# Patient Record
Sex: Female | Born: 2000 | Race: Black or African American | Hispanic: No | Marital: Single | Smoking: Never smoker
Health system: Southern US, Community
[De-identification: ages and names within clinical notes are randomized; demographics above are authoritative.]

## PROBLEM LIST (undated history)

## (undated) DIAGNOSIS — J45909 Unspecified asthma, uncomplicated: Secondary | ICD-10-CM

## (undated) HISTORY — PX: DILATION AND CURETTAGE OF UTERUS: SHX78

---
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
## 2020-10-13 ENCOUNTER — Ambulatory Visit (HOSPITAL_COMMUNITY)
Admission: EM | Admit: 2020-10-13 | Discharge: 2020-10-13 | Disposition: A | Discharge status: Home / Self Care | Payer: Medicaid Other | Attending: Urgent Care | Admitting: Urgent Care

## 2020-10-13 ENCOUNTER — Other Ambulatory Visit: Payer: Self-pay

## 2020-10-13 ENCOUNTER — Encounter (HOSPITAL_COMMUNITY): Payer: Self-pay

## 2020-10-13 ENCOUNTER — Inpatient Hospital Stay (HOSPITAL_BASED_OUTPATIENT_CLINIC_OR_DEPARTMENT_OTHER): Payer: Medicaid Other

## 2020-10-13 ENCOUNTER — Encounter (HOSPITAL_COMMUNITY): Payer: Self-pay | Admitting: Obstetrics & Gynecology

## 2020-10-13 ENCOUNTER — Inpatient Hospital Stay (HOSPITAL_COMMUNITY)
Admission: EM | Admit: 2020-10-13 | Discharge: 2020-10-13 | Disposition: A | Discharge status: Home / Self Care | Payer: Medicaid Other | Attending: Family Medicine | Admitting: Family Medicine

## 2020-10-13 DIAGNOSIS — O208 Other hemorrhage in early pregnancy: Secondary | ICD-10-CM | POA: Insufficient documentation

## 2020-10-13 DIAGNOSIS — R879 Unspecified abnormal finding in specimens from female genital organs: Secondary | ICD-10-CM

## 2020-10-13 DIAGNOSIS — N898 Other specified noninflammatory disorders of vagina: Secondary | ICD-10-CM | POA: Diagnosis not present

## 2020-10-13 DIAGNOSIS — O4692 Antepartum hemorrhage, unspecified, second trimester: Secondary | ICD-10-CM | POA: Diagnosis not present

## 2020-10-13 DIAGNOSIS — O468X2 Other antepartum hemorrhage, second trimester: Secondary | ICD-10-CM

## 2020-10-13 DIAGNOSIS — O418X20 Other specified disorders of amniotic fluid and membranes, second trimester, not applicable or unspecified: Secondary | ICD-10-CM

## 2020-10-13 DIAGNOSIS — Z3A14 14 weeks gestation of pregnancy: Secondary | ICD-10-CM | POA: Diagnosis not present

## 2020-10-13 DIAGNOSIS — O209 Hemorrhage in early pregnancy, unspecified: Secondary | ICD-10-CM | POA: Diagnosis present

## 2020-10-13 HISTORY — DX: Unspecified asthma, uncomplicated: J45.909

## 2020-10-13 LAB — URINALYSIS, ROUTINE W REFLEX MICROSCOPIC
Bilirubin Urine: NEGATIVE
Glucose, UA: NEGATIVE mg/dL
Hgb urine dipstick: NEGATIVE
Ketones, ur: NEGATIVE mg/dL
Leukocytes,Ua: NEGATIVE
Nitrite: NEGATIVE
Protein, ur: NEGATIVE mg/dL
Specific Gravity, Urine: 1.019 (ref 1.005–1.030)
pH: 6 (ref 5.0–8.0)

## 2020-10-13 LAB — WET PREP, GENITAL
Clue Cells Wet Prep HPF POC: NONE SEEN
Sperm: NONE SEEN
Trich, Wet Prep: NONE SEEN
Yeast Wet Prep HPF POC: NONE SEEN

## 2020-10-13 LAB — HIV ANTIBODY (ROUTINE TESTING W REFLEX): HIV Screen 4th Generation wRfx: NONREACTIVE

## 2020-10-13 LAB — POC URINE PREG, ED: Preg Test, Ur: POSITIVE — AB

## 2020-10-13 LAB — POCT FERN TEST: POCT Fern Test: NEGATIVE

## 2020-10-13 NOTE — Discharge Instructions (Signed)
Dr. Noralee Space (Maternal Fetal Medicine MD) recommends repeat MFM U/S in 1 week.  Seek care if pass blood clot and/or tissue Tylenol 1000 mg every 6 hours as needed for pain -- see below    Safe Medications in Pregnancy   Acne: Benzoyl Peroxide Salicylic Acid  Backache/Headache: Tylenol: 2 regular strength every 4 hours OR              2 Extra strength every 6 hours  Colds/Coughs/Allergies: Benadryl (alcohol free) 25 mg every 6 hours as needed Breath right strips Claritin Cepacol throat lozenges Chloraseptic throat spray Cold-Eeze- up to three times per day Cough drops, alcohol free Flonase (by prescription only) Guaifenesin Mucinex Robitussin DM (plain only, alcohol free) Saline nasal spray/drops Sudafed (pseudoephedrine) & Actifed ** use only after [redacted] weeks gestation and if you do not have high blood pressure Tylenol Vicks Vaporub Zinc lozenges Zyrtec   Constipation: Colace Ducolax suppositories Fleet enema Glycerin suppositories Metamucil Milk of magnesia Miralax Senokot Smooth move tea  Diarrhea: Kaopectate Imodium A-D  *NO pepto Bismol  Hemorrhoids: Anusol Anusol HC Preparation H Tucks  Indigestion: Tums Maalox Mylanta Zantac  Pepcid  Insomnia: Benadryl (alcohol free) 25mg  every 6 hours as needed Tylenol PM Unisom, no Gelcaps  Leg Cramps: Tums MagGel  Nausea/Vomiting:  Bonine Dramamine Emetrol Ginger extract Sea bands Meclizine  Nausea medication to take during pregnancy:  Unisom (doxylamine succinate 25 mg tablets) Take one tablet daily at bedtime. If symptoms are not adequately controlled, the dose can be increased to a maximum recommended dose of two tablets daily (1/2 tablet in the morning, 1/2 tablet mid-afternoon and one at bedtime). Vitamin B6 100mg  tablets. Take one tablet twice a day (up to 200 mg per day).  Skin Rashes: Aveeno products Benadryl cream or 25mg  every 6 hours as needed Calamine Lotion 1% cortisone  cream  Yeast infection: Gyne-lotrimin 7 Monistat 7   **If taking multiple medications, please check labels to avoid duplicating the same active ingredients **take medication as directed on the label ** Do not exceed 4000 mg of tylenol in 24 hours **Do not take medications that contain aspirin or ibuprofen

## 2020-10-13 NOTE — MAU Provider Note (Signed)
History     CSN: [id number]  Arrival date and time: 10/13/20 1132   Event Date/Time   First Provider Initiated Contact with Patient 10/13/20 1741      Chief Complaint  Patient presents with   Spotting   Ms. Anne Cervantes is a 20 y.o. year old G33P0010 female at [redacted]w[redacted]d weeks gestation who was sent to MAU from [facility] reporting vaginal spotting x 2 days and some bilateral lower pelvis cramping that she "thought was RLP."  She states the spotting started out as "brown in the the discharge" with wiping after using the BR. She receives [facility] with [facility] in [place], [place]. She reports having a "spot on her cervix that went away and unsure of the name, but something with her placenta that is 1.4 away from cervix." She is in [place] today visiting family.  Patient was able to pull up U/S results from MyChart on her phone (as shown below):     OB History     Gravida  2   Para      Term      Preterm      AB  1   Living         SAB  1   IAB      Ectopic      Multiple      Live Births              Past Medical History:  Diagnosis Date   Asthma     Past Surgical History:  Procedure Laterality Date   DILATION AND CURETTAGE OF UTERUS      Family History  Problem Relation Age of Onset   Healthy Mother    Healthy Father     Social History   Tobacco Use   Smoking status: Never   Smokeless tobacco: Never  Vaping Use   Vaping Use: Never used  Substance Use Topics   Alcohol use: Never   Drug use: Never    Allergies: Not on File  No medications prior to admission.    Review of Systems  Constitutional: Negative.   HENT: Negative.    Eyes: Negative.   Respiratory: Negative.    Cardiovascular: Negative.   Gastrointestinal: Negative.   Endocrine: Negative.   Genitourinary:  Positive for vaginal bleeding (brown spotting).  Musculoskeletal: Negative.   Skin: Negative.   Allergic/Immunologic: Negative.   Neurological: Negative.    Hematological: Negative.   Psychiatric/Behavioral: Negative.    Physical Exam   Blood pressure 115/74, pulse 96, temperature 98.7 F (37.1 C), resp. rate 18, height 5\' 3"  (1.6 m), weight 58.4 kg, SpO2 100 %.  Physical Exam Vitals and nursing note reviewed. Exam conducted with a chaperone present.  Constitutional:      Appearance: Normal appearance. She is normal weight.  Pulmonary:     Effort: Pulmonary effort is normal.  Abdominal:     Palpations: Abdomen is soft.  Genitourinary:    General: Normal vulva.     Comments: Pelvic exam: External genitalia normal, SE: vaginal walls pink and well rugated, cervix is smooth, pink, no lesions, moderate amt of light brown watery vaginal d/c -- WP, GC/CT done, cervix visually closed, Uterus is non-tender, no CMT or friability, no adnexal tenderness.  Musculoskeletal:        General: Normal range of motion.  Skin:    General: Skin is warm and dry.  Neurological:     Mental Status: She is alert and oriented to person, place, and  time.  Psychiatric:        Mood and Affect: Mood normal.        Behavior: Behavior normal.        Thought Content: Thought content normal.        Judgment: Judgment normal.    MAU Course  Procedures  MDM Wet Prep GC/CT -- Results pending Crist Fat Slide  Results for orders placed or performed during the hospital encounter of 10/13/20 (from the past 24 hour(s))  Urinalysis, Routine w reflex microscopic Urine, Clean Catch     Status: Abnormal   Collection Time: 10/13/20  3:08 PM  Result Value Ref Range   Color, Urine YELLOW YELLOW   APPearance HAZY (A) CLEAR   Specific Gravity, Urine 1.019 1.005 - 1.030   pH 6.0 5.0 - 8.0   Glucose, UA NEGATIVE NEGATIVE mg/dL   Hgb urine dipstick NEGATIVE NEGATIVE   Bilirubin Urine NEGATIVE NEGATIVE   Ketones, ur NEGATIVE NEGATIVE mg/dL   Protein, ur NEGATIVE NEGATIVE mg/dL   Nitrite NEGATIVE NEGATIVE   Leukocytes,Ua NEGATIVE NEGATIVE  HIV Antibody (routine testing w rflx)      Status: None   Collection Time: 10/13/20  4:53 PM  Result Value Ref Range   HIV Screen 4th Generation wRfx Non Reactive Non Reactive  Wet prep, genital     Status: Abnormal   Collection Time: 10/13/20  5:36 PM   Specimen: Vaginal  Result Value Ref Range   Yeast Wet Prep HPF POC NONE SEEN NONE SEEN   Trich, Wet Prep NONE SEEN NONE SEEN   Clue Cells Wet Prep HPF POC NONE SEEN NONE SEEN   WBC, Wet Prep HPF POC MANY (A) NONE SEEN   Sperm NONE SEEN   POCT fern test     Status: None   Collection Time: 10/13/20  6:40 PM  Result Value Ref Range   POCT Fern Test Negative = intact amniotic membranes    *Consult with Dr. Judeth Cornfield @ 2026 - discussed U/S results, recommended tx plan advise patient of possible blood clot in LUS, seek care if she passes anything that looks like tissue and take it with her or seek care if it is a blood clot, but no need to save the blood clot; needs to have F/U MFM U/S in 1 week.  Discussed results and Dr. Zannie Kehr recommendation with Dr. Macon Large @ 2030- ok to d/c home, agrees with Dr. Zannie Kehr plan   [place] MFM OB Transvaginal  Result Date: 10/13/2020 ----------------------------------------------------------------------  OBSTETRICS REPORT                         (Signed Final 10/13/2020 08:50 pm) ---------------------------------------------------------------------- Patient Info  ID #:        [id number]                          D.O.B.:  [birth year] (20 yrs)  Name:        Anne Cervantes                 Visit Date: 10/13/2020 08:00 pm ---------------------------------------------------------------------- Performed By  Attending:         Noralee Space MD        Referred By:       [facility] MAU/Triage  Performed By:      Emeline Darling BS,      [facility]                                                               [facility] ---------------------------------------------------------------------- Orders  #   Description                          Code         Ordered By  1   US MFM OB LIMITED                    76815.01     Bleu Minerd  2   US MFM OB TRANSVAGINAL               [id number]      Zenab Gronewold ----------------------------------------------------------------------  #   Order #                    Accession #                 Episode #  1   [id number]                  [id number]                  [id number]  2   [id number]                  [id number]                  [id number] ---------------------------------------------------------------------- Indications  Vaginal bleeding in pregnancy, second           O46.92  trimester  [redacted] weeks gestation of pregnancy                 Z3A.14  Subchorionic hemorrhage, antepartum             O45.90 ---------------------------------------------------------------------- Fetal Evaluation  Num Of Fetuses:          1  Fetal Heart Rate(bpm):   166  Cardiac Activity:        Observed  Presentation:            Cephalic  Placenta:                Visualized Posterior  P. Cord Insertion:       Visualized  Amniotic Fluid  AFI FV:      Within normal limits                              Largest Pocket(cm)  3.8 ---------------------------------------------------------------------- OB History  Gravidity:     2         Term:  0          Prem:  0        SAB:   1  TOP:           0       Ectopic: 0         Living: 0 ---------------------------------------------------------------------- Gestational Age  Best:           14w 1d    Det. By:  Marcella Dubs           EDD:  04/12/21 ---------------------------------------------------------------------- Anatomy  Stomach:                Visualized             Abdominal Wall:         Visualized ---------------------------------------------------------------------- Cervix Uterus Adnexa  Cervix  Normal appearance by transabdominal scan.   Uterus  LUS complex area measures 5.7 x 4.3 x 4.9 cm ---------------------------------------------------------------------- Impression  Patient is being  evaluated for c/o vaginal bleeding (spotting).  She does not have severe abdominal pain.  Patient had ultrasound at an outpatient facility of [facility]  on 10/03/20 and the ultrasound showed an intrauterine pregnancy consistent with 12w 5d. A small  subchorionic hemorrhage was reported. No twin pregnancy  was detected  (MyChart Report on Patient's phone scanned).  A limited ultrasound study was performed. Good fetal heart  activity is seen. Placenta is posterior. An irregular mass in the  lower uterus at the inferior edge of the placenta, measuring 5.7  x 4.3 x 4.9 cm, is seen. This could represent a blood clot.  Discussed with MAU Raelyn Mora). ---------------------------------------------------------------------- Recommendations  -Discharge instruction should include the possibility of passing a  large blood clot.  -Follow-up scan in a week (MFM). ----------------------------------------------------------------------                  Noralee Space, MD Electronically Signed Final Report   10/13/2020 08:50 pm ----------------------------------------------------------------------  Assessment and Plan  Abnormal vaginal fluids  - Reassurance given that fern slide was negative for rupture of membranes  Subchorionic hematoma in second trimester  - Information provided on [facility] and vaginal bleeding in 2nd trimester pregnancy  - Advised ok to take Tylenol 1000 mg every 6 hrs prn cramping  - Discharge patient  - Advised to call her OB provider to get a sooner f/u appt scheduled - Patient verbalized an understanding of the plan of care and agrees.    Raelyn Mora, CNM 10/13/2020, 5:41 PM

## 2020-10-13 NOTE — MAU Note (Signed)
Presents with c/o spotting that began 2 days ago, reports started as light brown, turned light pink, now it's light brown.  Endorses abdominal mild pain/cramping.

## 2020-10-13 NOTE — ED Provider Notes (Signed)
Emergency Medicine Provider OB Triage Evaluation Note  Anne Cervantes is a 20 y.o. female, G2P0, at roughly 14w gestation who presents to the emergency department with complaints of vaginal bleeding x2 days. Describes bleeding as brown spotting. No clots. Not enough to fill a pad. Patient is from [place] and has already had an [place] to confirm IUP during this pregnancy. Vaginal bleeding associated with abdominal cramps.   Review of  Systems  Positive: vaginal bleeding Negative: fever  Physical Exam  BP 118/75   Pulse 93   Temp 98.5 F (36.9 C)   Resp 18   SpO2 100%  General: Awake, no distress  HEENT: Atraumatic  Resp: Normal effort  Cardiac: Normal rate Abd: Nondistended, nontender  MSK: Moves all extremities without difficulty Neuro: Speech clear  Medical Decision Making  Pt evaluated for pregnancy concern and is stable for transfer to MAU. Pt is in agreement with plan for transfer.  12:40 PM Discussed with MAU MD, Dr. Adrian Blackwater, who accepts patient in transfer.  Clinical Impression  No diagnosis found.     Jesusita Oka 10/13/20 1242    Arby Barrette, MD 10/23/20 [phone]

## 2020-10-13 NOTE — ED Triage Notes (Addendum)
pt states she is [redacted]weeks pregnant and has been having vaginal spotting for two days. Vaginal discharge started brown, pink for one day and today its brown discharge. pt states small amount. denies headaches/ dizziness. pt states some abd pain that comes and goes, denies flank pain.   Patient is being discharged from the Urgent Care and sent to the Emergency Department via personal vehicle.  Per [place] PA, patient is in need of higher level of care due to vaginal discharge fore two days. Patient is aware and verbalizes understanding of plan of care.  Vitals:   10/13/20 1115  BP: 112/68  Pulse: 95  Resp: 16  Temp: 98.7 F (37.1 C)  SpO2: 100%

## 2020-10-14 LAB — GC/CHLAMYDIA PROBE AMP (~~LOC~~) NOT AT ARMC
Chlamydia: NEGATIVE
Comment: NEGATIVE
Comment: NORMAL
Neisseria Gonorrhea: NEGATIVE

## 2020-10-27 ENCOUNTER — Other Ambulatory Visit: Payer: Self-pay

## 2020-10-27 ENCOUNTER — Inpatient Hospital Stay (HOSPITAL_COMMUNITY)
Admission: AD | Admit: 2020-10-27 | Discharge: 2020-10-28 | Disposition: A | Discharge status: Home / Self Care | Payer: Medicaid Other | Attending: Obstetrics & Gynecology | Admitting: Obstetrics & Gynecology

## 2020-10-27 DIAGNOSIS — O209 Hemorrhage in early pregnancy, unspecified: Secondary | ICD-10-CM | POA: Diagnosis present

## 2020-10-27 DIAGNOSIS — O418X20 Other specified disorders of amniotic fluid and membranes, second trimester, not applicable or unspecified: Secondary | ICD-10-CM

## 2020-10-27 DIAGNOSIS — R109 Unspecified abdominal pain: Secondary | ICD-10-CM | POA: Diagnosis present

## 2020-10-27 DIAGNOSIS — Z3A16 16 weeks gestation of pregnancy: Secondary | ICD-10-CM | POA: Insufficient documentation

## 2020-10-27 DIAGNOSIS — O99612 Diseases of the digestive system complicating pregnancy, second trimester: Secondary | ICD-10-CM | POA: Diagnosis not present

## 2020-10-27 DIAGNOSIS — O26892 Other specified pregnancy related conditions, second trimester: Secondary | ICD-10-CM | POA: Insufficient documentation

## 2020-10-27 DIAGNOSIS — O99512 Diseases of the respiratory system complicating pregnancy, second trimester: Secondary | ICD-10-CM | POA: Insufficient documentation

## 2020-10-27 DIAGNOSIS — O208 Other hemorrhage in early pregnancy: Secondary | ICD-10-CM | POA: Diagnosis not present

## 2020-10-27 DIAGNOSIS — O468X2 Other antepartum hemorrhage, second trimester: Secondary | ICD-10-CM | POA: Diagnosis not present

## 2020-10-27 DIAGNOSIS — K5909 Other constipation: Secondary | ICD-10-CM | POA: Diagnosis not present

## 2020-10-27 NOTE — MAU Note (Signed)
Pt states she was seen here 2 weeks ago for bleeding and has had bleeding off/on but tonight it got a lot heavier. Mild cramping x 2 days.

## 2020-10-28 ENCOUNTER — Inpatient Hospital Stay (HOSPITAL_BASED_OUTPATIENT_CLINIC_OR_DEPARTMENT_OTHER): Payer: Medicaid Other

## 2020-10-28 ENCOUNTER — Encounter (HOSPITAL_COMMUNITY): Payer: Self-pay | Admitting: Obstetrics & Gynecology

## 2020-10-28 DIAGNOSIS — O4592 Premature separation of placenta, unspecified, second trimester: Secondary | ICD-10-CM | POA: Diagnosis not present

## 2020-10-28 DIAGNOSIS — O321XX0 Maternal care for breech presentation, not applicable or unspecified: Secondary | ICD-10-CM

## 2020-10-28 DIAGNOSIS — O4692 Antepartum hemorrhage, unspecified, second trimester: Secondary | ICD-10-CM

## 2020-10-28 DIAGNOSIS — Z3A16 16 weeks gestation of pregnancy: Secondary | ICD-10-CM

## 2020-10-28 DIAGNOSIS — O99612 Diseases of the digestive system complicating pregnancy, second trimester: Secondary | ICD-10-CM | POA: Diagnosis not present

## 2020-10-28 DIAGNOSIS — K5909 Other constipation: Secondary | ICD-10-CM | POA: Diagnosis not present

## 2020-10-28 DIAGNOSIS — O468X2 Other antepartum hemorrhage, second trimester: Secondary | ICD-10-CM

## 2020-10-28 LAB — URINALYSIS, MICROSCOPIC (REFLEX)

## 2020-10-28 LAB — URINALYSIS, ROUTINE W REFLEX MICROSCOPIC
Bilirubin Urine: NEGATIVE
Glucose, UA: NEGATIVE mg/dL
Ketones, ur: NEGATIVE mg/dL
Nitrite: NEGATIVE
Protein, ur: NEGATIVE mg/dL
Specific Gravity, Urine: 1.025 (ref 1.005–1.030)
pH: 6 (ref 5.0–8.0)

## 2020-10-28 MED ORDER — POLYETHYLENE GLYCOL 3350 17 G PO PACK: 17.0000 g | PACK | Freq: Every day | ORAL | 0 refills | Status: AC

## 2020-10-28 NOTE — MAU Provider Note (Signed)
Chief Complaint:  Vaginal Bleeding and Abdominal Pain   Event Date/Time   First Provider Initiated Contact with Patient 10/28/20 0043     HPI: Anne Cervantes is a 20 y.o. G2P0010 at [redacted]w[redacted]d who presents to maternity admissions reporting increased dark brown vaginal bleeding. She has been seen for this issue before, both here in MAU and at her regular OB at [facility] in [place]. She has two areas of concern, a [facility] and then an area of blood collection just above the cervix (which has remained long and closed). Her follow up ultrasounds are captured in Media. She has had dark brown spotting to bleeding daily but today is heavier. No IC since the bleeding began. Notes bleeding is always heavier after ultrasounds, but last one was about a week ago. No other physical symptoms.  Pregnancy Course: See above.  Past Medical History:  Diagnosis Date   Asthma    OB History  Gravida Para Term Preterm AB Living  2       1    SAB IAB Ectopic Multiple Live Births  1            # Outcome Date GA Lbr Len/2nd Weight Sex Delivery Anes PTL Lv  2 Current           1 SAB 04/12/20 [redacted]w[redacted]d          Past Surgical History:  Procedure Laterality Date   DILATION AND CURETTAGE OF UTERUS     Family History  Problem Relation Age of Onset   Healthy Mother    Healthy Father    Social History   Tobacco Use   Smoking status: Never   Smokeless tobacco: Never  Vaping Use   Vaping Use: Never used  Substance Use Topics   Alcohol use: Never   Drug use: Never   Not on File No medications prior to admission.    I have reviewed patient's Past Medical Hx, Surgical Hx, Family Hx, Social Hx, medications and allergies.   ROS:  Pertinent items noted in HPI and remainder of comprehensive ROS otherwise negative.  Physical Exam  Patient Vitals for the past 24 hrs:  BP Temp Temp src Pulse Resp SpO2  10/28/20 0224 116/77 98.7 F (37.1 C) Oral 99 18 --  10/27/20 2358 111/75 98.7 F (37.1 C) Oral (!) 102 17 100 %    Constitutional: Well-developed, well-nourished female in no acute distress.  Cardiovascular: normal rate & rhythm, no murmur Respiratory: normal effort, lung sounds clear throughout GI: Abd soft, non-tender, gravid appropriate for gestational age. Pos BS x 4 MS: Extremities nontender, no edema, normal ROM Neurologic: Alert and oriented x 4.  GU: no CVA tenderness Pelvic deferred, sent to U/S   FHR: 157   Labs: Results for orders placed or performed during the hospital encounter of 10/27/20 (from the past 24 hour(s))  Urinalysis, Routine w reflex microscopic Urine, Clean Catch     Status: Abnormal   Collection Time: 10/27/20 11:36 PM  Result Value Ref Range   Color, Urine YELLOW YELLOW   APPearance CLOUDY (A) CLEAR   Specific Gravity, Urine 1.025 1.005 - 1.030   pH 6.0 5.0 - 8.0   Glucose, UA NEGATIVE NEGATIVE mg/dL   Hgb urine dipstick LARGE (A) NEGATIVE   Bilirubin Urine NEGATIVE NEGATIVE   Ketones, ur NEGATIVE NEGATIVE mg/dL   Protein, ur NEGATIVE NEGATIVE mg/dL   Nitrite NEGATIVE NEGATIVE   Leukocytes,Ua SMALL (A) NEGATIVE  Urinalysis, Microscopic (reflex)     Status: Abnormal  Collection Time: 10/27/20 11:36 PM  Result Value Ref Range   RBC / HPF 6-10 0 - 5 RBC/hpf   WBC, UA 6-10 0 - 5 WBC/hpf   Bacteria, UA RARE (A) NONE SEEN   Squamous Epithelial / LPF 6-10 0 - 5   Mucus PRESENT     Imaging:  See media report for preliminary (which makes no mention of areas of bleeding). Report directly from sonographer Olean Ree) was that areas of bleeding had decreased slightly from previous measurements. Blood noted on exam wand, but no free flowing blood.  MAU Course: Orders Placed This Encounter  Procedures   [place] MFM OB LIMITED   US MFM OB Transvaginal   Urinalysis, Routine w reflex microscopic Urine, Clean Catch   Urinalysis, Microscopic (reflex)   Discharge patient   Meds ordered this encounter  Medications   polyethylene glycol (MIRALAX) 17 g packet    Sig: Take  17 g by mouth daily.    Dispense:  14 each    Refill:  0    Order Specific Question:   Supervising Provider    Answer:   Samara Snide   MDM: Discussed findings with patient and reviewed signs that warrant immediate return to MAU. Advised continued pelvic rest and light duty only. Pt expressed understanding.  Assessment: 1. Subchorionic hematoma in second trimester, single or unspecified fetus   2. Vaginal bleeding affecting early pregnancy   3. Other constipation    Plan: Discharge home in stable condition with second trimester bleeding precautions.    Follow-up Information     [facility] - [place]. Go to.   Why: as scheduled for ongoing prenatal care                Allergies as of 10/28/2020   Not on File      Medication List     TAKE these medications    multivitamin-prenatal 27-0.8 MG Tabs tablet Take 1 tablet by mouth daily at 12 noon.   polyethylene glycol 17 g packet Commonly known as: MiraLax Take 17 g by mouth daily.       Edd Arbour, CNM, MSN, IBCLC Certified Nurse Midwife, [facility]

## 2020-10-28 NOTE — Discharge Instructions (Signed)

## 2023-01-07 IMAGING — US US MFM OB LIMITED
1 series · 14 of 28 positions shown · non-contrast
Comparison: none

[Series 1: us mfm ob limited · 63 acquisitions, 14 frames shown]
[im 3/63]
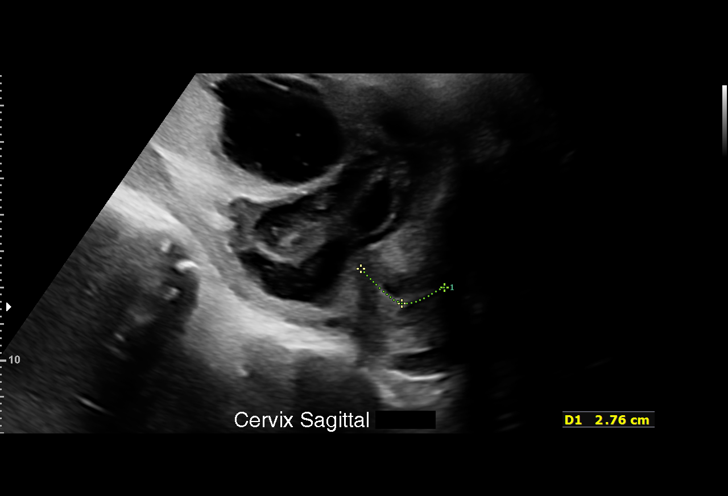
[im 7/63]
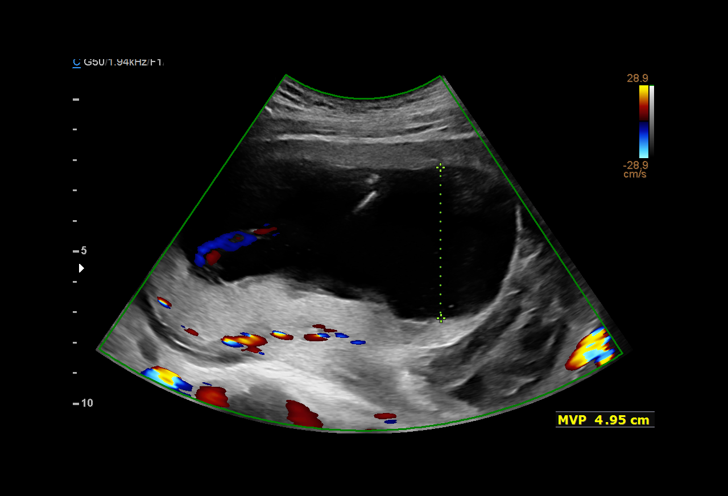
[im 12/63]
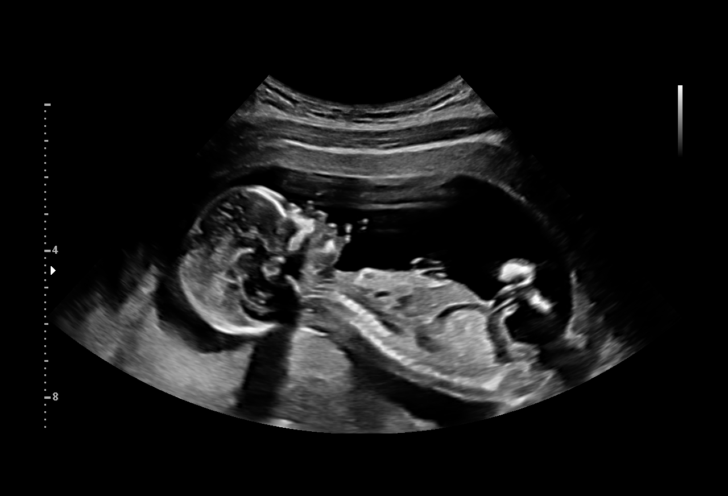
[im 17/63]
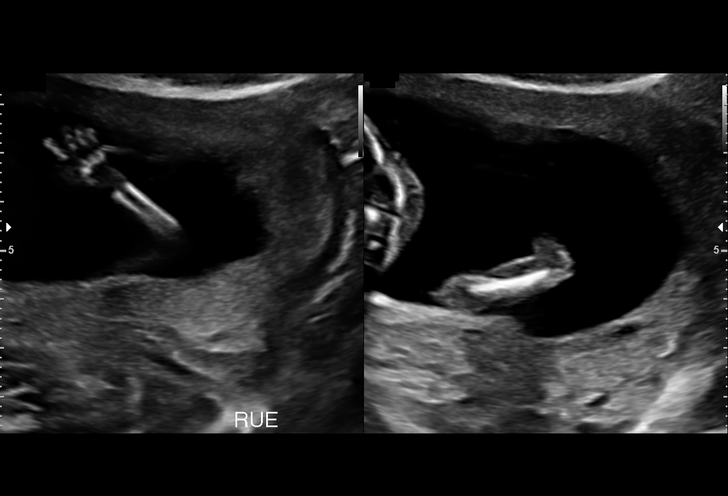
[im 21/63]
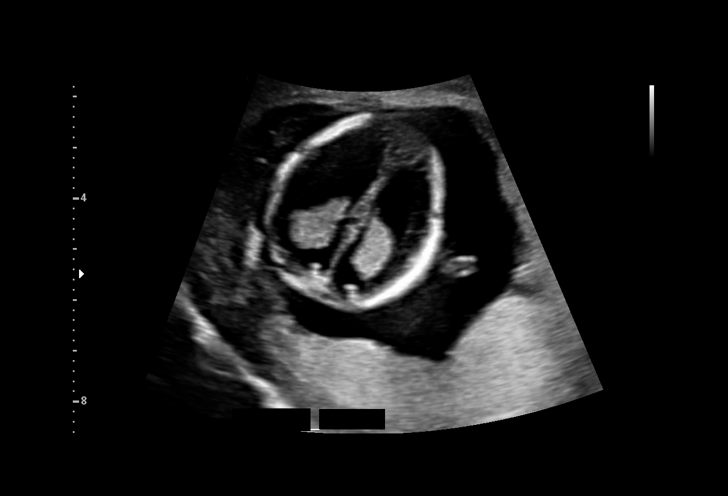
[im 26/63]
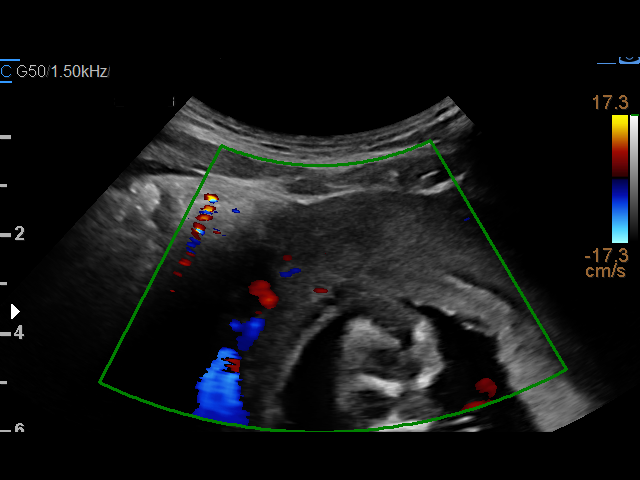
[im 30/63]
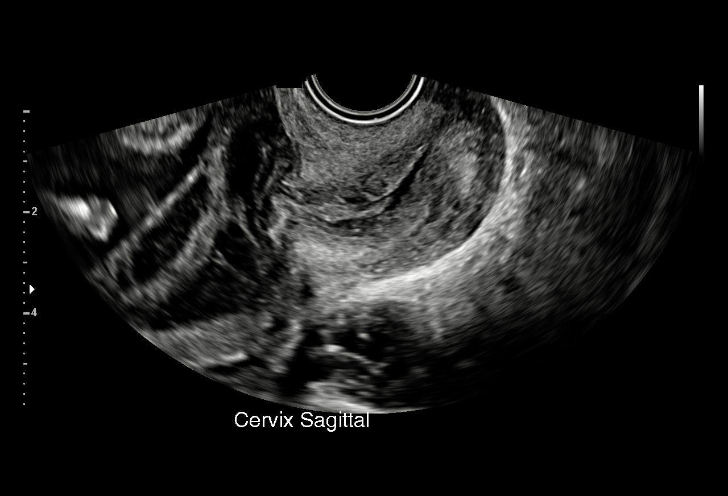
[im 35/63]
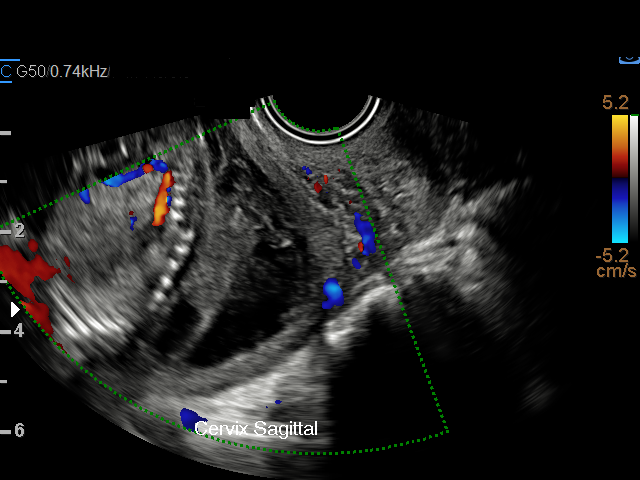
[im 40/63]
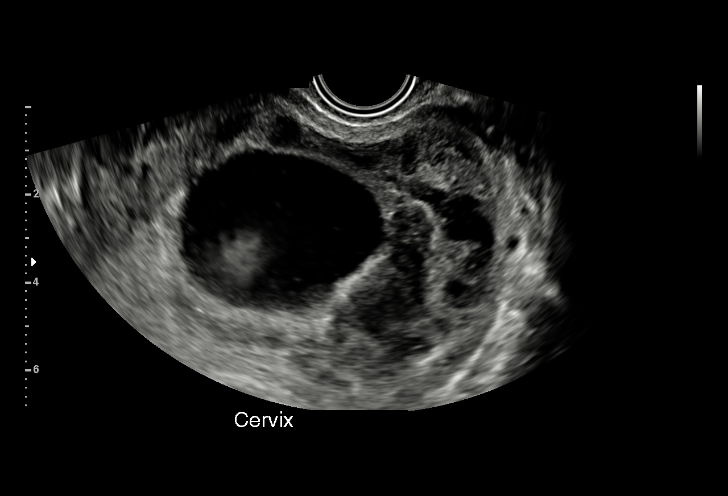
[im 44/63]
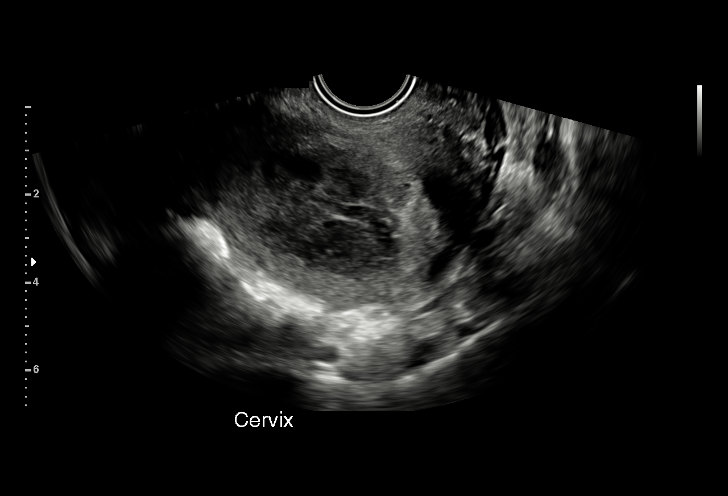
[im 49/63]
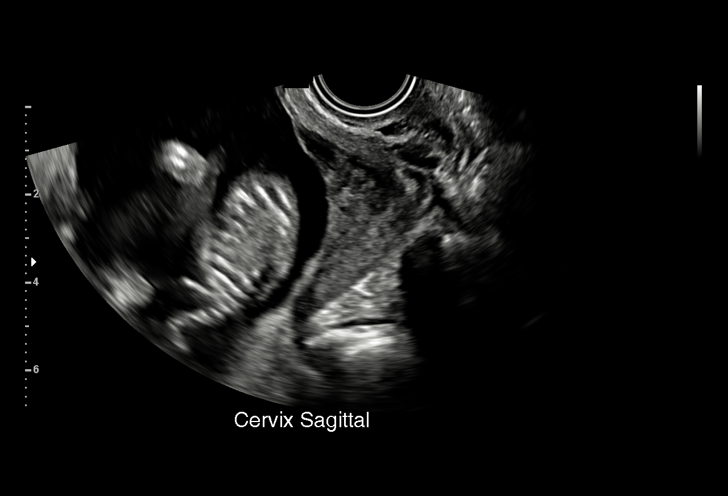
[im 53/63]
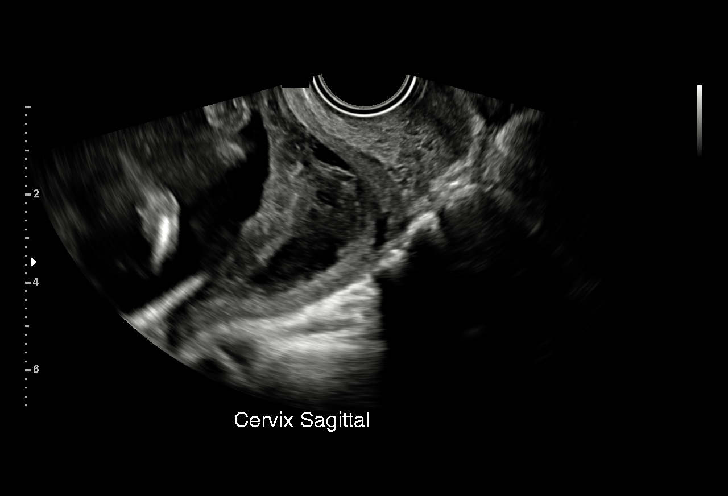
[im 58/63]
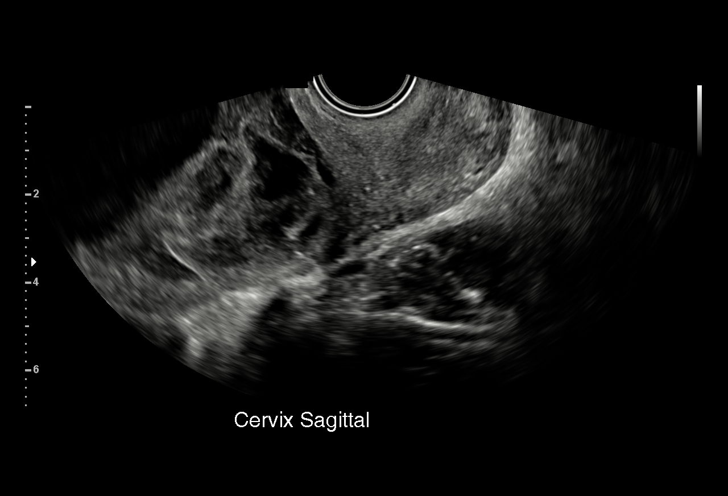
[im 63/63]
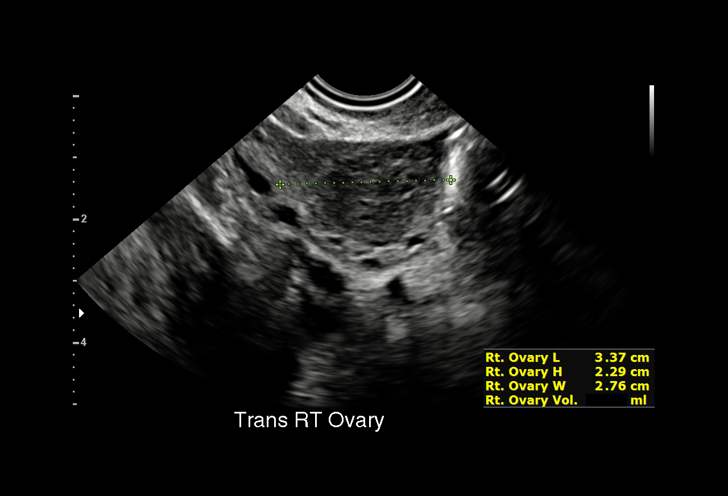

[14 of 28 positions shown; findings below may reference images not displayed]

WALKER CNM
                                                            [REDACTED]care
 Referred By:      [facility] MAU/Triage         [facility]

 1  US MFM OB LIMITED                     76815.01    [facility]
 2  US MFM OB TRANSVAGINAL                76817.2     [facility]

Indications

 Vaginal bleeding in pregnancy, second
 trimester
 Subchorionic hemorrhage, antepartum
 16 weeks gestation of pregnancy
Fetal Evaluation

 Num Of Fetuses:         1
 Fetal Heart Rate(bpm):  162
 Cardiac Activity:       Observed
 Presentation:           Breech
 Placenta:               Posterior
 P. Cord Insertion:      Visualized

 Amniotic Fluid
 AFI FV:      Within normal limits

                             Largest Pocket(cm)
                             5.
OB History

 Gravidity:    2         Term:   0        Prem:   0        SAB:   1
 TOP:          0       Ectopic:  0        Living: 0
Gestational Age

 Best:          16w 2d     Det. By:  Early Ultrasound         EDD:   04/12/21
Anatomy

 Cranium:               Appears normal         Stomach:                Appears normal, left
                                                                       sided
 Choroid Plexus:        Appears normal         Abdomen:                Appears normal
 Face:                  Appears normal         Bladder:                Appears normal
                        (orbits and profile)
 Thoracic:              Appears normal         Upper Extremities:      Appears normal
 Diaphragm:             Appears normal         Lower Extremities:      Appears normal
Cervix Uterus Adnexa

 Cervix
 Length:            3.7  cm.
 Normal appearance by transvaginal scan

 Uterus
 LUS Complex Area (4.5 x 2.7 x 6.2 cm)

 Right Ovary
 Size(cm)     3.37   x   2.76   x  2.29      Vol(ml):
 Within normal limits.

 Left Ovary
 Not visualized.

 Cul De Sac
 No free fluid seen.

 Adnexa
 No abnormality visualized.
Comments

 This patient presented to the [facility] due to vaginal bleeding.

 A singleton gestation was noted in the breech presentation.

 A 4 to 5 cm blood clot was noted directly above the internal
 cervical os.  A subchorionic hematoma was also noted in the
 anterior portion of the uterus.

 A transvaginal cervical length performed today was 3.7 cm
 long without any signs of funneling.

 She should have a detailed fetal anatomy scan scheduled in
 the [HOSPITAL] at around 19 to 20 weeks.
